# Patient Record
Sex: Male | Born: 1980 | Race: Black or African American | Hispanic: Yes | Marital: Single
Health system: Southern US, Community
[De-identification: ages and names within clinical notes are randomized; demographics above are authoritative.]

---

## 2020-09-04 ENCOUNTER — Emergency Department (HOSPITAL_COMMUNITY): Payer: Self-pay

## 2020-09-04 ENCOUNTER — Other Ambulatory Visit: Payer: Self-pay

## 2020-09-04 ENCOUNTER — Encounter (HOSPITAL_COMMUNITY): Payer: Self-pay | Admitting: Emergency Medicine

## 2020-09-04 ENCOUNTER — Emergency Department (HOSPITAL_COMMUNITY)
Admission: EM | Admit: 2020-09-04 | Discharge: 2020-09-04 | Disposition: A | Discharge status: Home / Self Care | Payer: Self-pay | Attending: Emergency Medicine | Admitting: Emergency Medicine

## 2020-09-04 DIAGNOSIS — S81812A Laceration without foreign body, left lower leg, initial encounter: Secondary | ICD-10-CM

## 2020-09-04 DIAGNOSIS — Z23 Encounter for immunization: Secondary | ICD-10-CM | POA: Insufficient documentation

## 2020-09-04 DIAGNOSIS — W010XXA Fall on same level from slipping, tripping and stumbling without subsequent striking against object, initial encounter: Secondary | ICD-10-CM | POA: Insufficient documentation

## 2020-09-04 DIAGNOSIS — S81012A Laceration without foreign body, left knee, initial encounter: Secondary | ICD-10-CM | POA: Insufficient documentation

## 2020-09-04 DIAGNOSIS — Y9289 Other specified places as the place of occurrence of the external cause: Secondary | ICD-10-CM | POA: Insufficient documentation

## 2020-09-04 DIAGNOSIS — Y9361 Activity, american tackle football: Secondary | ICD-10-CM | POA: Insufficient documentation

## 2020-09-04 MED ORDER — TETANUS-DIPHTH-ACELL PERTUSSIS 5-2.5-18.5 LF-MCG/0.5 IM SUSY
0.5000 mL | PREFILLED_SYRINGE | Freq: Once | INTRAMUSCULAR | Status: AC
  Administered 2020-09-04: 0.5 mL via INTRAMUSCULAR
  Filled 2020-09-04: qty 0.5

## 2020-09-04 MED ORDER — LIDOCAINE HCL (PF) 1 % IJ SOLN: 5.0000 mL | Freq: Once | INTRAMUSCULAR | Status: DC

## 2020-09-04 MED ORDER — LIDOCAINE-EPINEPHRINE (PF) 2 %-1:200000 IJ SOLN
10.0000 mL | Freq: Once | INTRAMUSCULAR | Status: AC
  Administered 2020-09-04: 10 mL
  Filled 2020-09-04: qty 20

## 2020-09-04 NOTE — ED Triage Notes (Signed)
Pt present with a laceration/ puncture under his L knee cap. States that he was playing football and landed on it. Unsure what caused the cut, but he was out in a field. Unknown last tetanus. No head injury or LOC. Able to walk and has full ROM. No numbness or tingling. Injury happened around 1p.

## 2020-09-04 NOTE — Discharge Instructions (Signed)
Return to the ED in 10 days for suture removal Attached is additional instructions on suture care While at home please rest, ice, and elevate your knee to help reduce pain/swelling. You can take Ibuprofen and Tylenol as needed for pain.   Return to the ED sooner for any signs of infection including redness/swelling around the wound, drainage of pus, fevers > 100.4, or any other new/worsening symptoms

## 2020-09-04 NOTE — ED Provider Notes (Signed)
[facility]-EMERGENCY DEPT Provider Note   CSN: [id number] Arrival date & time: 09/04/20  1921     History Chief Complaint  Patient presents with  . Laceration    Gregory Park is a 40 y.o. male who presents to the ED today with complaint of laceration distal to his left knee that occurred around 1 PM today. Pt was playing football when he fell to the ground. He reports afterwards he noticed he was bleeding. He is unsure what caused the laceration but assumes something was laying on the ground that cut him. He went home and bandaged his leg however it continued to bleed prompting his ED visit. He is not anticoagulated. He complains of mild pain to the area however otherwise has no complaints. He is unsure regarding last tetanus status. Believes it may be within 4-5 years however is unsure.   The history is provided by the patient and medical records.       History reviewed. No pertinent past medical history.  There are no problems to display for this patient.   History reviewed. No pertinent family history.     Home Medications Prior to Admission medications   Not on File    Allergies    Patient has no known allergies.  Review of Systems   Review of Systems  Constitutional: Negative for chills and fever.  Musculoskeletal: Positive for arthralgias.  Skin: Positive for wound.  All other systems reviewed and are negative.   Physical Exam Updated Vital Signs BP (!) 158/115 (BP Location: Right Arm)   Pulse 85   Temp 98.3 F (36.8 C) (Oral)   Resp 17   Ht 5\' 8"  (1.727 m)   Wt 83.9 kg   SpO2 100%   BMI 28.13 kg/m   Physical Exam Vitals and nursing note reviewed.  Constitutional:      Appearance: He is not ill-appearing.  HENT:     Head: Normocephalic and atraumatic.  Eyes:     Conjunctiva/sclera: Conjunctivae normal.  Cardiovascular:     Rate and Rhythm: Normal rate and regular rhythm.     Pulses: Normal pulses.  Pulmonary:      Effort: Pulmonary effort is normal.     Breath sounds: Normal breath sounds. No wheezing, rhonchi or rales.  Musculoskeletal:     Comments: 1 cm deep laceration/puncture wound noted to left leg just distal to the knee joint medially; actively bleeding. Mild TTP. ROM intact to knee without ligamentous injury. 2+ DP pulse. Cap refill < 2 seconds.   Skin:    General: Skin is warm and dry.     Coloration: Skin is not jaundiced.  Neurological:     Mental Status: He is alert.     ED Results / Procedures / Treatments   Labs (all labs ordered are listed, but only abnormal results are displayed) Labs Reviewed - No data to display  EKG None  Radiology DG Knee Complete 4 Views Left  Result Date: 09/04/2020 CLINICAL DATA:  Laceration puncture EXAM: LEFT KNEE - COMPLETE 4+ VIEW COMPARISON:  None. FINDINGS: No evidence of fracture, dislocation, or joint effusion. No evidence of arthropathy or other focal bone abnormality. Soft tissues are unremarkable. Bandage material over the proximal lower leg IMPRESSION: Negative. Electronically Signed   By: 09/06/2020 M.D.   On: 09/04/2020 20:28    Procedures .06/01/2022Laceration Repair  Date/Time: 09/04/2020 8:53 PM Performed by: 09/06/2020, PA-C Authorized by: Tanda Rockers, PA-C   Consent:    Consent obtained:  Verbal   Risks discussed:  Infection, pain and poor cosmetic result Treatment:    Area cleansed with:  Povidone-iodine   Irrigation solution:  Sterile saline   Irrigation method:  Pressure wash   Layers/structures repaired:  Deep dermal/superficial fascia Deep dermal/superficial fascia:    Suture size:  4-0   Suture material:  Vicryl   Suture technique:  Horizontal mattress   Number of sutures:  1 Skin repair:    Repair method:  Sutures   Suture size:  4-0   Suture material:  Prolene   Suture technique:  Simple interrupted   Number of sutures:  3 Approximation:    Approximation:  Close Repair type:    Repair type:   Simple Post-procedure details:    Dressing:  Non-adherent dressing   Procedure completion:  Tolerated well, no immediate complications     Medications Ordered in ED Medications  Tdap (BOOSTRIX) injection 0.5 mL (has no administration in time range)  lidocaine-EPINEPHrine (XYLOCAINE W/EPI) 2 %-1:200000 (PF) injection 10 mL (has no administration in time range)    ED Course  I have reviewed the triage vital signs and the nursing notes.  Pertinent labs & imaging results that were available during my care of the patient were reviewed by me and considered in my medical decision making (see chart for details).    MDM Rules/Calculators/A&P                          40 year old male who presents to the ED today after sustaining a laceration to his left knee around 1 PM today while playing football, unsure what caused the cut.  Unsure regarding tetanus status.  Actively bleeding at home prompting him to come to the ED.  On arrival to the ED vitals are stable.  Patient appears to be in no acute distress.  Blood pressure is elevated 158/115.  We will continue to monitor.  On my exam he has a 1 cm deep laceration/puncture wound just distal to the knee joint medially on the left side with active bleeding.  We will plan for x-ray at this time given unknown what caused the laceration to rule out any foreign body material in the wound itself.  Patient will need stitches.  We will thoroughly irrigate the wound prior to stitches.  Update tetanus.  Xray negative for FB or other abnormalities. Pt tolerated sutures well. Stable for discharge.   This note was prepared using Dragon voice recognition software and may include unintentional dictation errors due to the inherent limitations of voice recognition software.  Final Clinical Impression(s) / ED Diagnoses Final diagnoses:  Laceration of left lower extremity, initial encounter    Rx / DC Orders ED Discharge Orders    None       Discharge  Instructions     Return to the ED in 10 days for suture removal Attached is additional instructions on suture care While at home please rest, ice, and elevate your knee to help reduce pain/swelling. You can take Ibuprofen and Tylenol as needed for pain.   Return to the ED sooner for any signs of infection including redness/swelling around the wound, drainage of pus, fevers > 100.4, or any other new/worsening symptoms       Tanda Rockers, PA-C 09/04/20 2055    Rozelle Logan, DO 09/04/20 2254

## 2020-09-27 ENCOUNTER — Emergency Department (HOSPITAL_COMMUNITY)
Admission: EM | Admit: 2020-09-27 | Discharge: 2020-09-27 | Disposition: A | Discharge status: Home / Self Care | Payer: Self-pay | Attending: Emergency Medicine | Admitting: Emergency Medicine

## 2020-09-27 ENCOUNTER — Other Ambulatory Visit: Payer: Self-pay

## 2020-09-27 DIAGNOSIS — S81012D Laceration without foreign body, left knee, subsequent encounter: Secondary | ICD-10-CM | POA: Insufficient documentation

## 2020-09-27 DIAGNOSIS — X58XXXD Exposure to other specified factors, subsequent encounter: Secondary | ICD-10-CM | POA: Insufficient documentation

## 2020-09-27 DIAGNOSIS — Z4802 Encounter for removal of sutures: Secondary | ICD-10-CM | POA: Insufficient documentation

## 2020-09-27 NOTE — Discharge Instructions (Addendum)
Continue to keep the area clean and dry.  Return emergency department any warmth, redness, drainage from the site.

## 2020-09-27 NOTE — ED Provider Notes (Signed)
[facility]-EMERGENCY DEPT Provider Note   CSN: [id number] Arrival date & time: 09/27/20  1650     History Chief Complaint  Patient presents with   Suture / Staple Removal    Gregory Park is a 40 y.o. male who presents for evaluation of suture removal.  Patient was seen here on 09/04/2020 for evaluation of laceration to his knee.  He comes for suture removal.  He states the area has been healing.  He has not noted any redness, warmth.  No drainage.  No fevers.  He has been able to walk on it without any difficulty.   Suture / Staple Removal      No past medical history on file.  There are no problems to display for this patient.   No past surgical history on file.     No family history on file.     Home Medications Prior to Admission medications   Not on File    Allergies    Patient has no known allergies.  Review of Systems   Review of Systems  Constitutional:  Negative for fever.  Skin:  Positive for wound. Negative for color change.  All other systems reviewed and are negative.  Physical Exam Updated Vital Signs BP (!) 176/115 (BP Location: Right Arm)   Pulse 92   Temp 98.7 F (37.1 C) (Oral)   Resp 18   SpO2 98%   Physical Exam Vitals and nursing note reviewed.  Constitutional:      Appearance: He is well-developed.  HENT:     Head: Normocephalic and atraumatic.  Eyes:     General: No scleral icterus.       Right eye: No discharge.        Left eye: No discharge.     Conjunctiva/sclera: Conjunctivae normal.  Pulmonary:     Effort: Pulmonary effort is normal.  Skin:    General: Skin is warm and dry.     Comments: Healed laceration with sutures in place on anterior aspect of left knee.  No surrounding warmth, erythema, edema.  No active drainage.  Neurological:     Mental Status: He is alert.  Psychiatric:        Speech: Speech normal.        Behavior: Behavior normal.    ED Results / Procedures / Treatments    Labs (all labs ordered are listed, but only abnormal results are displayed) Labs Reviewed - No data to display  EKG None  Radiology No results found.  Procedures .Suture Removal  Date/Time: 09/27/2020 5:07 PM Performed by: Maxwell Caul, PA-C Authorized by: Maxwell Caul, PA-C   Consent:    Consent obtained:  Verbal   Consent given by:  Patient   Risks, benefits, and alternatives were discussed: yes     Risks discussed:  Bleeding, pain and wound separation Universal protocol:    Procedure explained and questions answered to patient or proxy's satisfaction: yes     Relevant documents present and verified: yes     Test results available: no     Imaging studies available: no     Required blood products, implants, devices, and special equipment available: no     Site/side marked: yes     Immediately prior to procedure, a time out was called: yes     Patient identity confirmed:  Verbally with patient Location:    Location:  Lower extremity   Lower extremity location:  Knee   Knee location:  L  knee Procedure details:    Wound appearance:  No signs of infection   Number of sutures removed:  3 Post-procedure details:    Procedure completion:  Tolerated   Medications Ordered in ED Medications - No data to display  ED Course  I have reviewed the triage vital signs and the nursing notes.  Pertinent labs & imaging results that were available during my care of the patient were reviewed by me and considered in my medical decision making (see chart for details).    MDM Rules/Calculators/A&P                          40 year old male who presents for evaluation of suture removal.  Had sutures in place on 09/04/2020.  No warmth, redness.  No fevers.  Active drainage.  On initial arrival, he is afebrile, toxic appearing.  Vital signs are stable.  On exam, he is a well-healed laceration on anterior aspect of left knee with sutures in place.  No surrounding warmth, erythema.   No active drainage.  No signs of infection.  Sutures removed as documented above.  Patient tolerated procedure well.  Encouraged at home supportive care measures. At this time, patient exhibits no emergent life-threatening condition that require further evaluation in ED. Patient had ample opportunity for questions and discussion. All patient's questions were answered with full understanding. Strict return precautions discussed. Patient expresses understanding and agreement to plan.   Portions of this note were generated with Scientist, clinical (histocompatibility and immunogenetics). Dictation errors may occur despite best attempts at proofreading.   Final Clinical Impression(s) / ED Diagnoses Final diagnoses:  Visit for suture removal    Rx / DC Orders ED Discharge Orders     None        Rosana Hoes 09/27/20 1710    Pollyann Savoy, MD 09/27/20 [phone]

## 2020-09-27 NOTE — ED Triage Notes (Signed)
Needs sutures removed from left knee, placed 14 days ago. No wound problems.

## 2022-09-03 IMAGING — CR DG KNEE COMPLETE 4+V*L*
4 series · 4 of 4 positions shown · non-contrast
Comparison: None.

CLINICAL DATA: Laceration puncture

EXAM:
LEFT KNEE - COMPLETE 4+ VIEW

[t knee ap left]
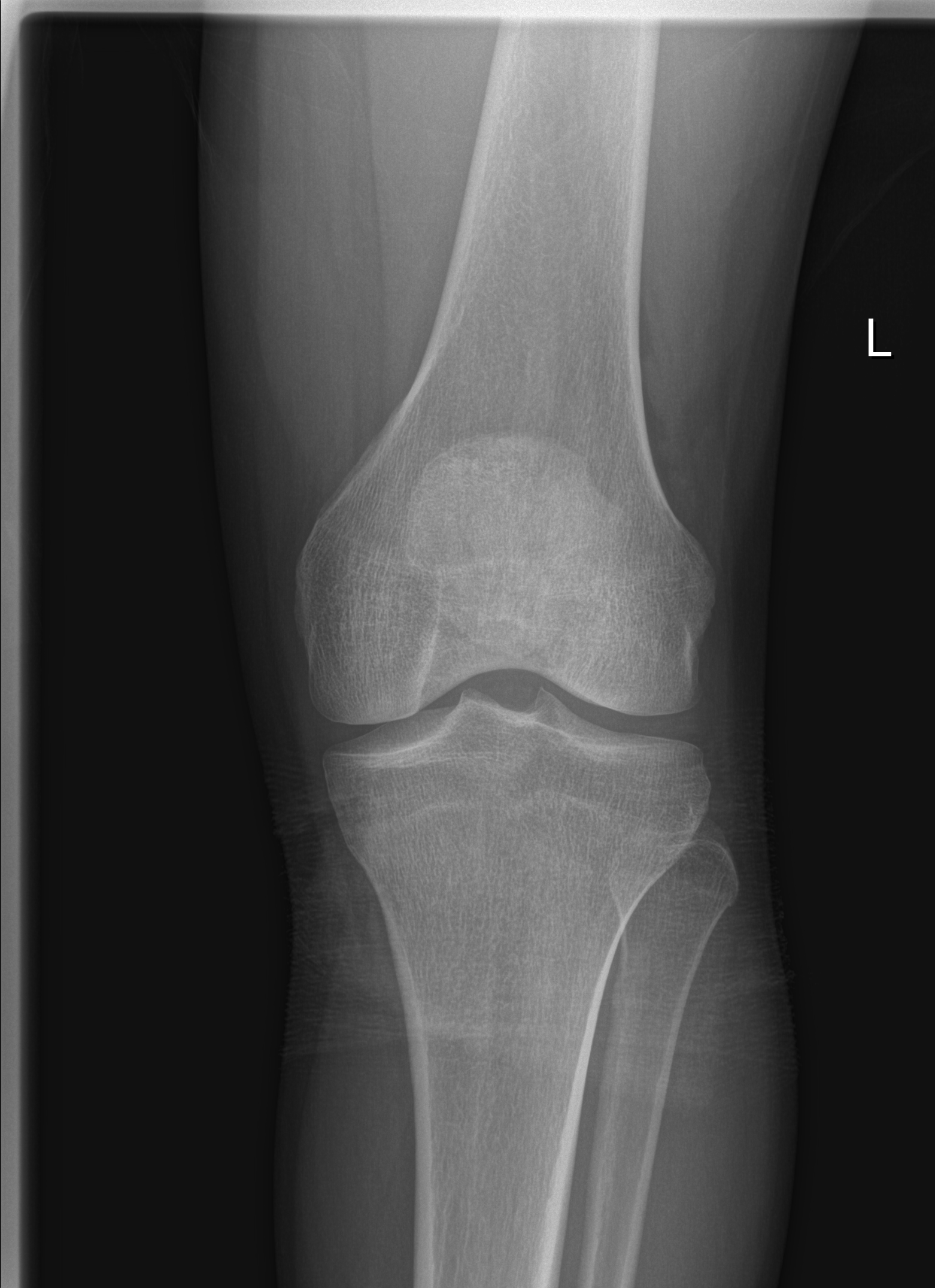

[t knee obl left (1 of 2)]
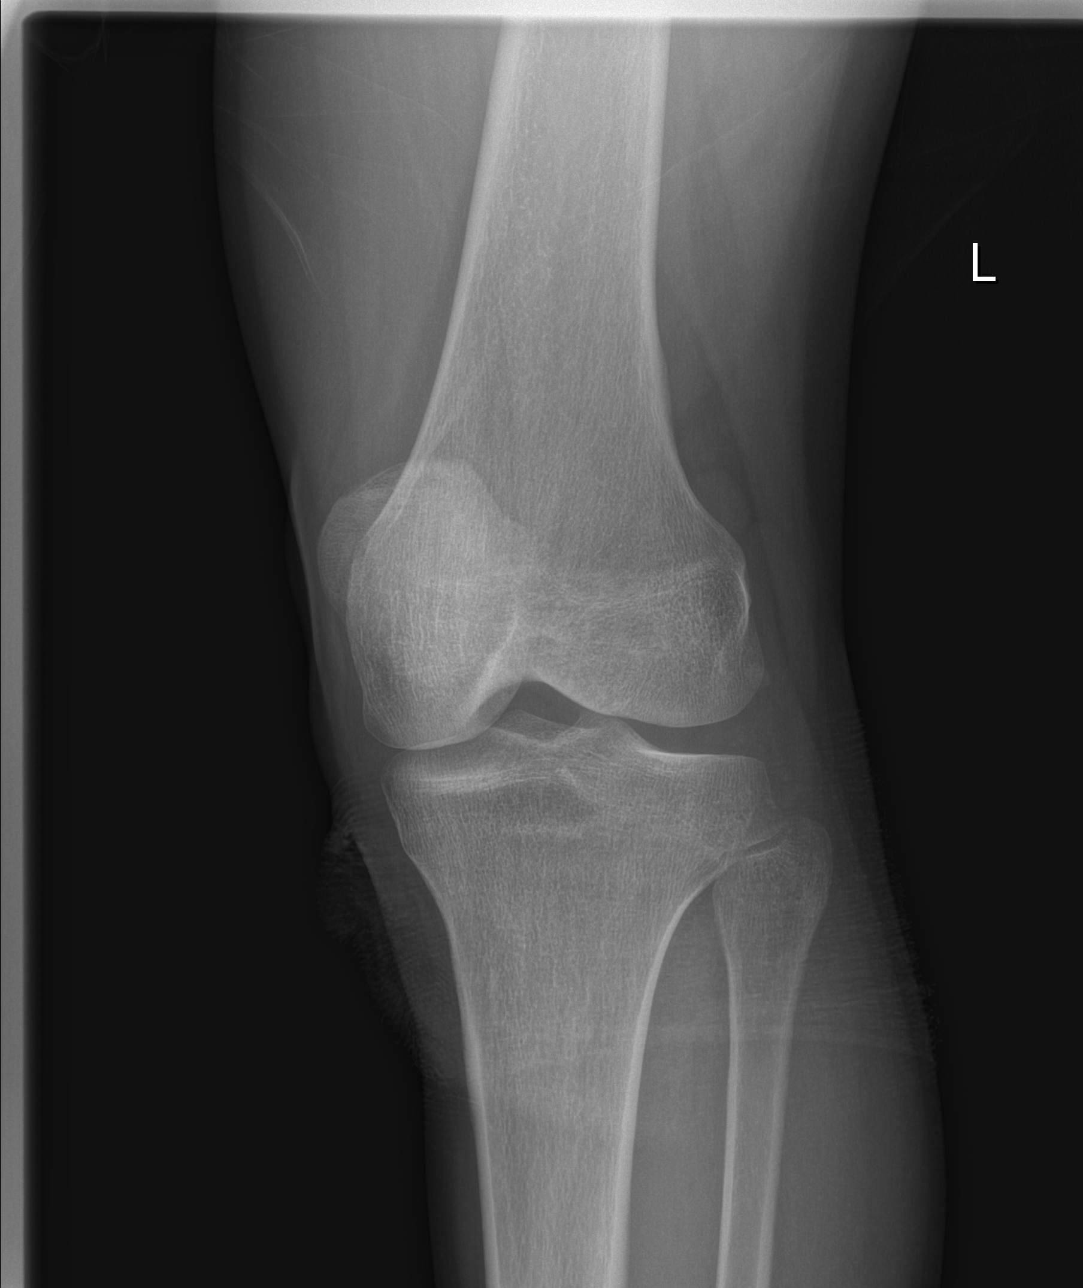

[t knee obl left (2 of 2)]
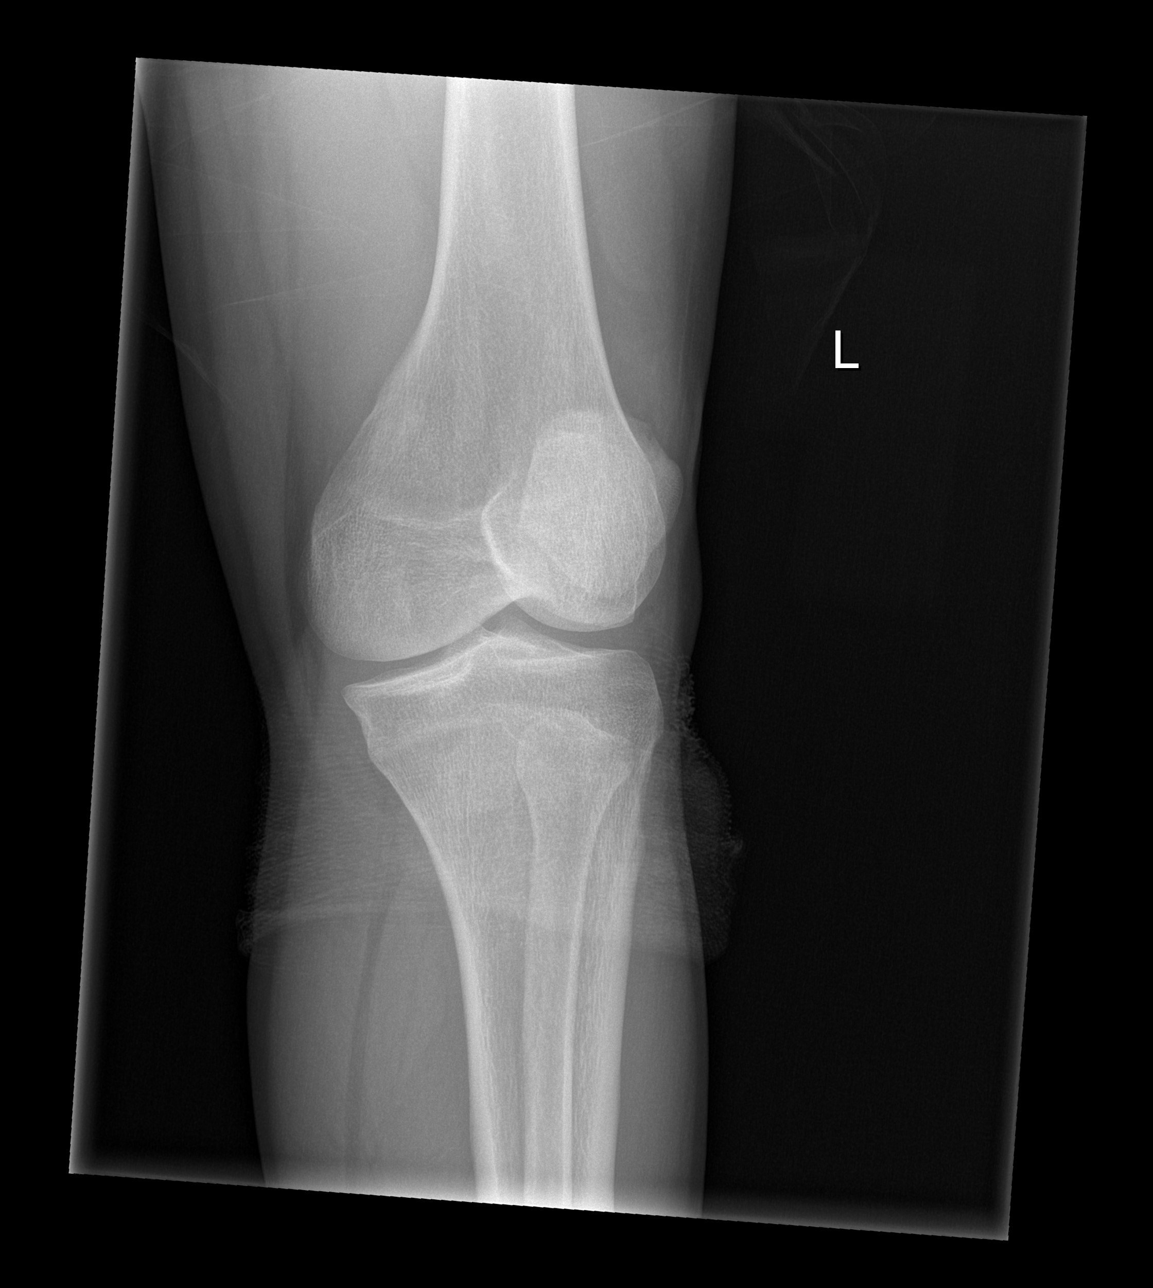

[t knee lat left]
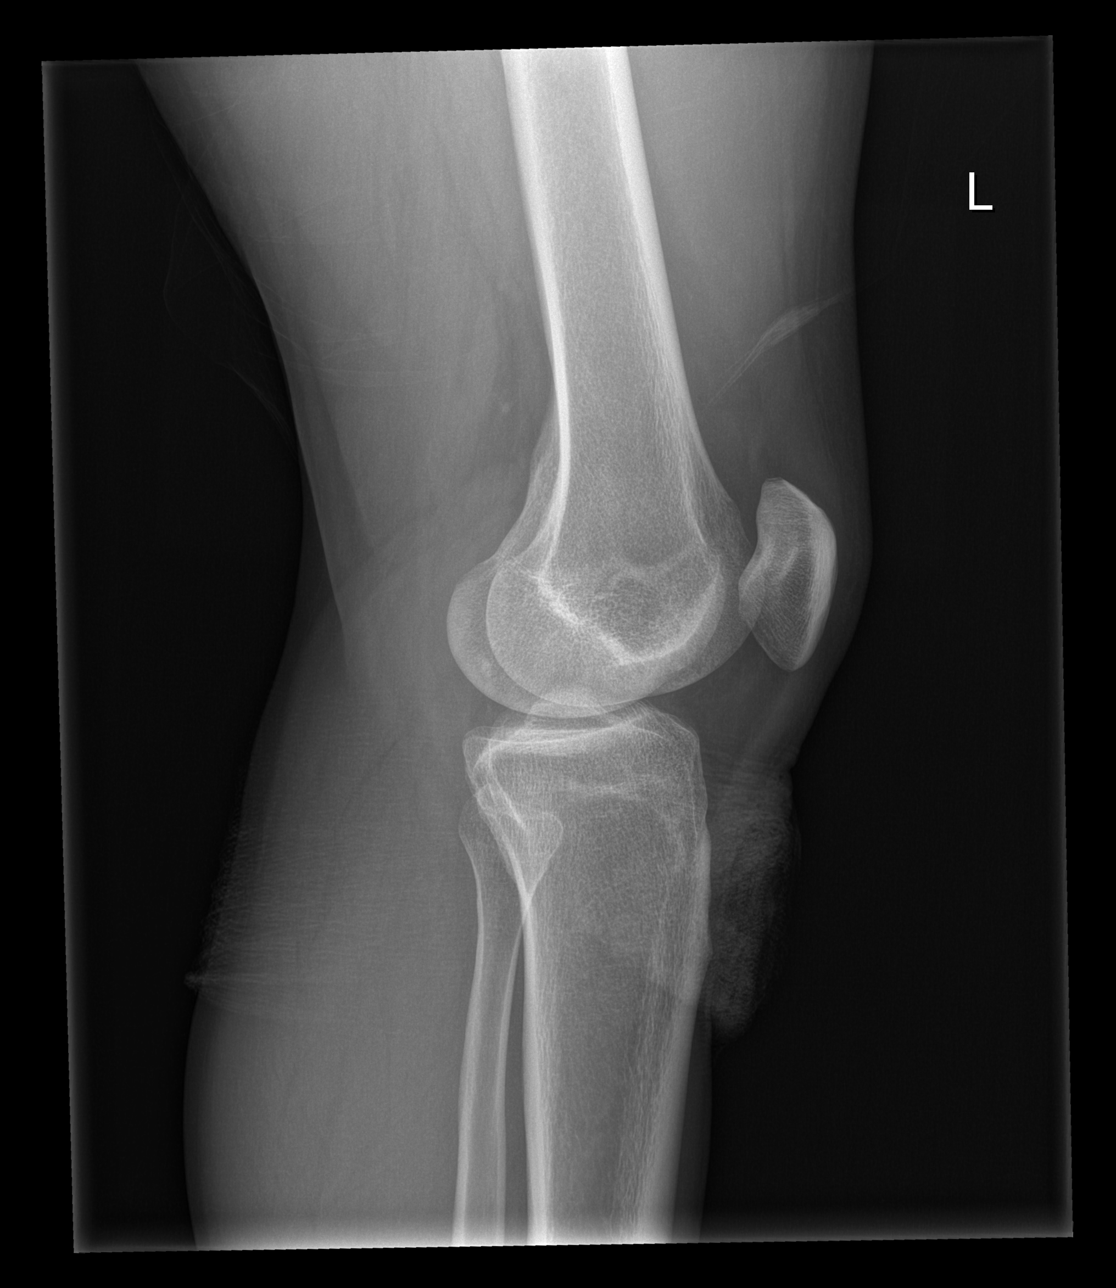

[4 of 4 positions shown; findings below may reference images not displayed]

FINDINGS: No evidence of fracture, dislocation, or joint effusion. No evidence
of arthropathy or other focal bone abnormality. Soft tissues are
unremarkable. Bandage material over the proximal lower leg
IMPRESSION: Negative.
